# Patient Record
Sex: Male | Born: 1940 | Race: White | Hispanic: No | Marital: Married | Smoking: Never smoker
Health system: Southern US, Community
[De-identification: ages and names within clinical notes are randomized; demographics above are authoritative.]

---

## 2003-09-08 ENCOUNTER — Emergency Department (HOSPITAL_COMMUNITY): Admission: EM | Admit: 2003-09-08 | Discharge: 2003-09-08 | Payer: Self-pay | Admitting: Podiatry

## 2003-12-27 ENCOUNTER — Ambulatory Visit (HOSPITAL_BASED_OUTPATIENT_CLINIC_OR_DEPARTMENT_OTHER): Admission: RE | Admit: 2003-12-27 | Discharge: 2003-12-27 | Payer: Self-pay | Admitting: Orthopedic Surgery

## 2003-12-27 ENCOUNTER — Ambulatory Visit (HOSPITAL_COMMUNITY): Admission: RE | Admit: 2003-12-27 | Discharge: 2003-12-27 | Payer: Self-pay | Admitting: Orthopedic Surgery

## 2018-12-09 ENCOUNTER — Other Ambulatory Visit: Payer: Self-pay

## 2018-12-09 ENCOUNTER — Emergency Department: Payer: Medicare PPO

## 2018-12-09 ENCOUNTER — Emergency Department
Admission: EM | Admit: 2018-12-09 | Discharge: 2018-12-09 | Disposition: A | Discharge status: Home / Self Care | Payer: Medicare PPO | Attending: Emergency Medicine | Admitting: Emergency Medicine

## 2018-12-09 ENCOUNTER — Encounter: Payer: Self-pay | Admitting: *Deleted

## 2018-12-09 DIAGNOSIS — Y939 Activity, unspecified: Secondary | ICD-10-CM | POA: Diagnosis not present

## 2018-12-09 DIAGNOSIS — S61213A Laceration without foreign body of left middle finger without damage to nail, initial encounter: Secondary | ICD-10-CM | POA: Insufficient documentation

## 2018-12-09 DIAGNOSIS — Y999 Unspecified external cause status: Secondary | ICD-10-CM | POA: Diagnosis not present

## 2018-12-09 DIAGNOSIS — Y929 Unspecified place or not applicable: Secondary | ICD-10-CM | POA: Diagnosis not present

## 2018-12-09 DIAGNOSIS — S61215A Laceration without foreign body of left ring finger without damage to nail, initial encounter: Secondary | ICD-10-CM | POA: Insufficient documentation

## 2018-12-09 DIAGNOSIS — W268XXA Contact with other sharp object(s), not elsewhere classified, initial encounter: Secondary | ICD-10-CM | POA: Insufficient documentation

## 2018-12-09 DIAGNOSIS — S61212A Laceration without foreign body of right middle finger without damage to nail, initial encounter: Secondary | ICD-10-CM | POA: Insufficient documentation

## 2018-12-09 MED ORDER — HYDROCODONE-ACETAMINOPHEN 5-325 MG PO TABS
1.0000 | ORAL_TABLET | Freq: Once | ORAL | Status: AC
  Administered 2018-12-09: 1 via ORAL
  Filled 2018-12-09: qty 1

## 2018-12-09 MED ORDER — HYDROCODONE-ACETAMINOPHEN 5-325 MG PO TABS: 1.0000 | ORAL_TABLET | Freq: Four times a day (QID) | ORAL | 0 refills | Status: AC | PRN

## 2018-12-09 MED ORDER — CEPHALEXIN 500 MG PO CAPS: 500.0000 mg | ORAL_CAPSULE | Freq: Three times a day (TID) | ORAL | 0 refills | Status: DC

## 2018-12-09 MED ORDER — LIDOCAINE HCL (PF) 1 % IJ SOLN
10.0000 mL | Freq: Once | INTRAMUSCULAR | Status: DC
Start: 2018-12-09 — End: 2018-12-09
  Filled 2018-12-09: qty 10

## 2018-12-09 MED ORDER — LIDOCAINE HCL (PF) 1 % IJ SOLN
5.0000 mL | Freq: Once | INTRAMUSCULAR | Status: DC
  Filled 2018-12-09: qty 5

## 2018-12-09 MED ORDER — LIDOCAINE HCL (PF) 1 % IJ SOLN
INTRAMUSCULAR | Status: AC
  Administered 2018-12-09: 2.1 mL
  Filled 2018-12-09: qty 5

## 2018-12-09 MED ORDER — BACITRACIN-NEOMYCIN-POLYMYXIN 400-5-5000 EX OINT
TOPICAL_OINTMENT | Freq: Once | CUTANEOUS | Status: DC
  Filled 2018-12-09: qty 1

## 2018-12-09 MED ORDER — LIDOCAINE HCL (PF) 1 % IJ SOLN
5.0000 mL | Freq: Once | INTRAMUSCULAR | Status: AC
  Administered 2018-12-09: 5 mL via INTRADERMAL
  Filled 2018-12-09: qty 5

## 2018-12-09 MED ORDER — CEFTRIAXONE SODIUM 1 G IJ SOLR
1.0000 g | Freq: Once | INTRAMUSCULAR | Status: AC
  Administered 2018-12-09: 1 g via INTRAMUSCULAR
  Filled 2018-12-09: qty 10

## 2018-12-09 NOTE — ED Provider Notes (Signed)
[facility] Emergency Department Provider Note  ____________________________________________   First MD Initiated Contact with Patient 12/09/18 1552     (approximate)  I have reviewed the triage vital signs and the nursing notes.   HISTORY  Chief Complaint Hand Injury    HPI Piper Albro is a 78 y.o. male emergency department after cutting his right and left hand on a [employer] saw.  The right third finger and the left third and fourth fingers are affected.  Tetanus is up-to-date.  States he had one 6 months ago.  He states he has full range of motion of his fingers.    No past medical history on file.  There are no active problems to display for this patient.     Prior to Admission medications   Medication Sig Start Date End Date Taking? Authorizing Provider  cephALEXin (KEFLEX) 500 MG capsule Take 1 capsule (500 mg total) by mouth 3 (three) times daily. 12/09/18   Aries Kasa, Roselyn Bering, PA-C  HYDROcodone-acetaminophen (NORCO/VICODIN) 5-325 MG tablet Take 1 tablet by mouth every 6 (six) hours as needed for moderate pain. 12/09/18   Faythe Ghee, PA-C    Allergies Sulfa antibiotics  No family history on file.  Social History Social History   Tobacco Use  . Smoking status: Never Smoker  . Smokeless tobacco: Never Used  Substance Use Topics  . Alcohol use: Yes  . Drug use: Not on file    Review of Systems  Constitutional: No fever/chills Eyes: No visual changes. ENT: No sore throat. Respiratory: Denies cough Genitourinary: Negative for dysuria. Musculoskeletal: Negative for back pain.  Positive lacerations to the left and the right hand Skin: Negative for rash.    ____________________________________________   PHYSICAL EXAM:  VITAL SIGNS: ED Triage Vitals  Enc Vitals Group     BP 12/09/18 1549 (!) 181/101     Pulse Rate 12/09/18 1549 97     Resp 12/09/18 1549 20     Temp 12/09/18 1549 98.4 F (36.9 C)     Temp Source  12/09/18 1549 Oral     SpO2 12/09/18 1549 98 %     Weight 12/09/18 1546 175 lb (79.4 kg)     Height 12/09/18 1546  (1.753 m)     Head Circumference --      Peak Flow --      Pain Score 12/09/18 1546 8     Pain Loc --      Pain Edu? --      Excl. in GC? --     Constitutional: Alert and oriented. Well appearing and in no acute distress. Eyes: Conjunctivae are normal.  Head: Atraumatic. Nose: No congestion/rhinnorhea. Mouth/Throat: Mucous membranes are moist.   Neck:  supple no lymphadenopathy noted Cardiovascular: Normal rate, regular rhythm. Respiratory: Normal respiratory effort.  No retractions,  GU: deferred Musculoskeletal: FROM all extremities, warm and well perfused, laceration on the left third finger as possible joint capsule involvement, no foreign bodies noted, tendons appear to be intact.  Left fourth finger has a laceration no foreign body and no tendon involvement.  The right third finger has a laceration with no foreign body, the laceration extends to the volar and dorsal surface.  No tendon involvement is noted.   Neurologic:  Normal speech and language.  Skin:  Skin is warm, dry . No rash noted. Psychiatric: Mood and affect are normal. Speech and behavior are normal.  ____________________________________________   LABS (all labs ordered are listed,  but only abnormal results are displayed)  Labs Reviewed - No data to display ____________________________________________   ____________________________________________  RADIOLOGY  X-ray of the right hand does not show any fractures X-ray of the left hand does not show any fractures  ____________________________________________   PROCEDURES  Procedure(s) performed:   Marland KitchenMarland KitchenLaceration Repair Date/Time: 12/09/2018 5:54 PM Performed by: Faythe Ghee, PA-C Authorized by: Faythe Ghee, PA-C   Consent:    Consent obtained:  Verbal   Consent given by:  Patient   Risks discussed:  Infection, pain,  retained foreign body, tendon damage, vascular damage, poor wound healing, poor cosmetic result, need for additional repair and nerve damage   Alternatives discussed:  Referral Anesthesia (see MAR for exact dosages):    Anesthesia method:  Nerve block   Block needle gauge:  25 G   Block anesthetic:  Lidocaine 1% w/o epi   Block injection procedure:  Anatomic landmarks identified, introduced needle, incremental injection, anatomic landmarks palpated and negative aspiration for blood   Block outcome:  Anesthesia achieved Laceration details:    Location:  Finger   Finger location:  L long finger   Length (cm):  2   Depth (mm):  4 Repair type:    Repair type:  Intermediate Pre-procedure details:    Preparation:  Patient was prepped and draped in usual sterile fashion Exploration:    Hemostasis achieved with:  Direct pressure   Wound exploration: wound explored through full range of motion     Wound extent: foreign bodies/material     Wound extent: no muscle damage noted, no nerve damage noted, no tendon damage noted, no underlying fracture noted and no vascular damage noted     Foreign bodies/material:  Metal   Contaminated: yes   Treatment:    Area cleansed with:  Betadine and saline   Amount of cleaning:  Extensive   Irrigation solution:  Sterile saline   Irrigation method:  Pressure wash, syringe and tap   Visualized foreign bodies/material removed: yes   Skin repair:    Repair method:  Sutures   Suture size:  5-0   Suture material:  Nylon   Suture technique:  Simple interrupted   Number of sutures:  10 Approximation:    Approximation:  Close Post-procedure details:    Dressing:  Antibiotic ointment, non-adherent dressing and splint for protection   Patient tolerance of procedure:  Tolerated well, no immediate complications Comments:     Joint capsule appears to be violated .Marland KitchenLaceration Repair Date/Time: 12/09/2018 5:56 PM Performed by: Faythe Ghee, PA-C Authorized by:  Faythe Ghee, PA-C   Consent:    Consent obtained:  Verbal   Consent given by:  Patient   Risks discussed:  Infection, pain, retained foreign body, tendon damage, poor cosmetic result, need for additional repair, nerve damage, poor wound healing and vascular damage   Alternatives discussed:  Referral Anesthesia (see MAR for exact dosages):    Anesthesia method:  Nerve block   Block needle gauge:  25 G   Block anesthetic:  Lidocaine 1% w/o epi   Block injection procedure:  Anatomic landmarks identified, introduced needle, incremental injection, anatomic landmarks palpated and negative aspiration for blood   Block outcome:  Anesthesia achieved Laceration details:    Location:  Finger   Finger location:  L ring finger   Length (cm):  2   Depth (mm):  2 Repair type:    Repair type:  Simple Pre-procedure details:    Preparation:  Patient was  prepped and draped in usual sterile fashion Exploration:    Hemostasis achieved with:  Direct pressure   Wound exploration: wound explored through full range of motion     Wound extent: no fascia violation noted, no foreign bodies/material noted, no muscle damage noted, no nerve damage noted, no tendon damage noted, no underlying fracture noted and no vascular damage noted     Contaminated: no   Treatment:    Area cleansed with:  Betadine and saline   Amount of cleaning:  Standard   Irrigation solution:  Sterile saline   Irrigation method:  Pressure wash, syringe and tap Skin repair:    Repair method:  Sutures   Suture size:  5-0   Suture material:  Nylon   Suture technique:  Simple interrupted   Number of sutures:  5 Approximation:    Approximation:  Close Post-procedure details:    Dressing:  Antibiotic ointment, non-adherent dressing and splint for protection   Patient tolerance of procedure:  Tolerated well, no immediate complications .Marland KitchenLaceration Repair Date/Time: 12/09/2018 5:57 PM Performed by: Faythe Ghee, PA-C Authorized  by: Faythe Ghee, PA-C   Consent:    Consent obtained:  Verbal   Consent given by:  Patient   Risks discussed:  Infection, pain, retained foreign body, tendon damage, poor cosmetic result, need for additional repair, nerve damage and vascular damage   Alternatives discussed:  Referral Anesthesia (see MAR for exact dosages):    Anesthesia method:  Nerve block   Block needle gauge:  25 G   Block anesthetic:  Lidocaine 1% w/o epi   Block injection procedure:  Anatomic landmarks identified, introduced needle, incremental injection, anatomic landmarks palpated and negative aspiration for blood   Block outcome:  Anesthesia achieved Laceration details:    Location:  Finger   Finger location:  R long finger   Length (cm):  4 Repair type:    Repair type:  Intermediate Pre-procedure details:    Preparation:  Patient was prepped and draped in usual sterile fashion and imaging obtained to evaluate for foreign bodies Exploration:    Hemostasis achieved with:  Direct pressure   Wound exploration: wound explored through full range of motion     Wound extent: no fascia violation noted, no foreign bodies/material noted, no muscle damage noted, no nerve damage noted, no tendon damage noted, no underlying fracture noted and no vascular damage noted     Contaminated: no   Treatment:    Area cleansed with:  Betadine and saline   Amount of cleaning:  Extensive   Irrigation solution:  Sterile saline   Irrigation method:  Pressure wash, syringe and tap   Visualized foreign bodies/material removed: yes   Skin repair:    Repair method:  Sutures   Suture size:  5-0   Suture material:  Nylon   Suture technique:  Simple interrupted   Number of sutures:  14 Approximation:    Approximation:  Close Post-procedure details:    Dressing:  Antibiotic ointment and non-adherent dressing   Patient tolerance of procedure:  Tolerated well, no immediate complications       ____________________________________________   INITIAL IMPRESSION / ASSESSMENT AND PLAN / ED COURSE  Pertinent labs & imaging results that were available during my care of the patient were reviewed by me and considered in my medical decision making (see chart for details).   Patient is 78 year old male presents emergency department lacerations to both hands.  He was using a [employer] and it  broke and resulted in cutting both hands.  Tetanus is up-to-date.  Deep lacerations noted on the left middle finger with possible joint capsule involvement.  The area was cleaned extensively.  See procedure note for repair.  Page Dr. Allena Katz to notify him of the patient's laceration to the joint capsule.  He agrees that the treatment plan I have and stated is appropriate.  He wants him to follow-up in his office either on Friday or or Monday.  Due to the contamination and the joint capsule involvement patient was given Rocephin 1 g IM.  He will be given a prescription for Keflex 500 3 times daily and Vicodin 1 p.o. #15 with no refill.  He is to follow-up with orthopedics.     As part of my medical decision making, I reviewed the following data within the electronic MEDICAL RECORD NUMBER Nursing notes reviewed and incorporated, Old chart reviewed, Radiograph reviewed x-rays of the left and right hands are negative for fractures, A consult was requested and obtained from this/these consultant(s) Orthopedics, Notes from prior ED visits and [place] Controlled Substance Database  ____________________________________________   FINAL CLINICAL IMPRESSION(S) / ED DIAGNOSES  Final diagnoses:  Laceration of right middle finger without foreign body without damage to nail, initial encounter  Laceration of left middle finger, foreign body presence unspecified, nail damage status unspecified, initial encounter  Laceration of left ring finger without damage to nail, foreign body presence unspecified, initial encounter       NEW MEDICATIONS STARTED DURING THIS VISIT:  New Prescriptions   CEPHALEXIN (KEFLEX) 500 MG CAPSULE    Take 1 capsule (500 mg total) by mouth 3 (three) times daily.   HYDROCODONE-ACETAMINOPHEN (NORCO/VICODIN) 5-325 MG TABLET    Take 1 tablet by mouth every 6 (six) hours as needed for moderate pain.     Note:  This document was prepared using Dragon voice recognition software and may include unintentional dictation errors.    Faythe Ghee, PA-C 12/09/18 Noralyn Pick, [place], MD 12/09/18 2040

## 2018-12-09 NOTE — ED Notes (Signed)
PT soaking hands in betadine solution

## 2018-12-09 NOTE — Discharge Instructions (Signed)
Follow-up with Dr. Allena Katz.  Please call his office in the morning for an appointment on Friday or Monday.  Wear the splint until evaluated by [facility] clinic orthopedics.  Take the medication as prescribed.  The dressing on the right hand may be changed in 24 hours.  Do not disrupt the dressing on the left hand.

## 2018-12-09 NOTE — ED Triage Notes (Addendum)
Pt states masonary grinder blew up.  Pt has lacs to right 3 finger, left 3rd and 4th fingers.  Bleeding controlled.  bp elevated.  Took meds this am.  Pt alert

## 2020-07-16 ENCOUNTER — Emergency Department (HOSPITAL_COMMUNITY)
Admission: EM | Admit: 2020-07-16 | Discharge: 2020-07-16 | Disposition: A | Discharge status: Home / Self Care | Payer: Medicare PPO | Attending: Emergency Medicine | Admitting: Emergency Medicine

## 2020-07-16 ENCOUNTER — Other Ambulatory Visit: Payer: Self-pay

## 2020-07-16 ENCOUNTER — Ambulatory Visit
Admission: EM | Admit: 2020-07-16 | Discharge: 2020-07-16 | Disposition: A | Discharge status: Home / Self Care | Payer: Medicare PPO | Attending: Physician Assistant | Admitting: Physician Assistant

## 2020-07-16 ENCOUNTER — Ambulatory Visit (INDEPENDENT_AMBULATORY_CARE_PROVIDER_SITE_OTHER): Payer: Medicare PPO

## 2020-07-16 ENCOUNTER — Emergency Department (HOSPITAL_COMMUNITY): Payer: Medicare PPO

## 2020-07-16 ENCOUNTER — Encounter (HOSPITAL_COMMUNITY): Payer: Self-pay | Admitting: Emergency Medicine

## 2020-07-16 DIAGNOSIS — M79621 Pain in right upper arm: Secondary | ICD-10-CM | POA: Diagnosis not present

## 2020-07-16 DIAGNOSIS — W1830XA Fall on same level, unspecified, initial encounter: Secondary | ICD-10-CM | POA: Diagnosis not present

## 2020-07-16 DIAGNOSIS — S63391A Traumatic rupture of other ligament of right wrist, initial encounter: Secondary | ICD-10-CM | POA: Diagnosis not present

## 2020-07-16 DIAGNOSIS — W19XXXA Unspecified fall, initial encounter: Secondary | ICD-10-CM | POA: Diagnosis not present

## 2020-07-16 DIAGNOSIS — S4991XA Unspecified injury of right shoulder and upper arm, initial encounter: Secondary | ICD-10-CM | POA: Diagnosis present

## 2020-07-16 DIAGNOSIS — S43004A Unspecified dislocation of right shoulder joint, initial encounter: Secondary | ICD-10-CM | POA: Insufficient documentation

## 2020-07-16 DIAGNOSIS — M25331 Other instability, right wrist: Secondary | ICD-10-CM

## 2020-07-16 DIAGNOSIS — M25531 Pain in right wrist: Secondary | ICD-10-CM | POA: Diagnosis not present

## 2020-07-16 MED ORDER — PROPOFOL 10 MG/ML IV BOLUS
0.5000 mg/kg | Freq: Once | INTRAVENOUS | Status: DC
  Filled 2020-07-16: qty 20

## 2020-07-16 MED ORDER — LIDOCAINE-EPINEPHRINE 1 %-1:100000 IJ SOLN: 10.0000 mL | Freq: Once | INTRAMUSCULAR | Status: DC

## 2020-07-16 MED ORDER — FENTANYL CITRATE (PF) 100 MCG/2ML IJ SOLN
100.0000 ug | Freq: Once | INTRAMUSCULAR | Status: AC
  Administered 2020-07-16: 100 ug via INTRAVENOUS
  Filled 2020-07-16: qty 2

## 2020-07-16 MED ORDER — LIDOCAINE HCL (PF) 1 % IJ SOLN
INTRAMUSCULAR | Status: AC
  Filled 2020-07-16: qty 30

## 2020-07-16 MED ORDER — PROPOFOL 10 MG/ML IV BOLUS
INTRAVENOUS | Status: AC | PRN
  Administered 2020-07-16 (×3): 10 mg via INTRAVENOUS

## 2020-07-16 MED ORDER — MORPHINE SULFATE 15 MG PO TABS
7.5000 mg | ORAL_TABLET | ORAL | 0 refills | Status: AC | PRN
Start: 2020-07-16 — End: ?

## 2020-07-16 MED ORDER — SODIUM CHLORIDE 0.9 % IV SOLN
INTRAVENOUS | Status: AC | PRN
  Administered 2020-07-16: 10 mL/h via INTRAVENOUS

## 2020-07-16 NOTE — Discharge Instructions (Signed)
79 year old male with right shoulder dislocation since yesterday.  Discharged in stable condition to the ED for reduction.

## 2020-07-16 NOTE — ED Triage Notes (Signed)
Pt states he feel retrieving wood for his fire pit and fell. He is experiencing tenderness from his shoulder to his wrist. Pt is aox4 and ambulatory. PT has what could be some deformity but definitely swelling.

## 2020-07-16 NOTE — ED Notes (Signed)
Pt resting in bed. Pain stable. AFVSS.

## 2020-07-16 NOTE — ED Provider Notes (Addendum)
[facility] EMERGENCY DEPARTMENT Provider Note   CSN: [id number] Arrival date & time: 07/16/20  1451     History Chief Complaint  Patient presents with  . Shoulder Injury    Connor Gardner is a 79 y.o. male.  79 yo M with a chief complaints of right shoulder pain.  This occurred after a fall last night.  The patient was walking to get more firewood and he tripped over a stump and landed onto the right shoulder.  Complaining of pain to the right shoulder and wrist.  Denies head injury denies loss consciousness denies neck pain back pain chest pain abdominal pain.  Denies extremity pain.  The patient went to urgent care today because of severe pain and spasm and difficulty using the right arm.  Found to have a right shoulder dislocation and a scapholunate disassociation.  Sent to the ED for evaluation.  The history is provided by the patient.  Shoulder Injury This is a new problem. The current episode started yesterday. The problem occurs constantly. The problem has been gradually worsening. Pertinent negatives include no chest pain, no abdominal pain, no headaches and no shortness of breath. Nothing aggravates the symptoms. Nothing relieves the symptoms. He has tried nothing for the symptoms. The treatment provided no relief.       History reviewed. No pertinent past medical history.  There are no problems to display for this patient.   History reviewed. No pertinent surgical history.     No family history on file.  Social History   Tobacco Use  . Smoking status: Never Smoker  . Smokeless tobacco: Never Used  Vaping Use  . Vaping Use: Never used  Substance Use Topics  . Alcohol use: Yes  . Drug use: Not Currently    Home Medications Prior to Admission medications   Medication Sig Start Date End Date Taking? Authorizing Provider  HYDROcodone-acetaminophen (NORCO/VICODIN) 5-325 MG tablet Take 1 tablet by mouth every 6 (six) hours as needed for  moderate pain. 12/09/18   Fisher, Roselyn Bering, PA-C  morphine (MSIR) 15 MG tablet Take 0.5 tablets (7.5 mg total) by mouth every 4 (four) hours as needed for severe pain. 07/16/20   Melene Plan, DO    Allergies    Sulfa antibiotics  Review of Systems   Review of Systems  Constitutional: Negative for chills and fever.  HENT: Negative for congestion and facial swelling.   Eyes: Negative for discharge and visual disturbance.  Respiratory: Negative for shortness of breath.   Cardiovascular: Negative for chest pain and palpitations.  Gastrointestinal: Negative for abdominal pain, diarrhea and vomiting.  Musculoskeletal: Positive for arthralgias and myalgias.  Skin: Negative for color change and rash.  Neurological: Negative for tremors, syncope and headaches.  Psychiatric/Behavioral: Negative for confusion and dysphoric mood.    Physical Exam Updated Vital Signs BP (!) 195/104 (BP Location: Left Arm)   Pulse (!) 107   Temp 97.8 F (36.6 C) (Oral)   Resp 18   Wt 75 kg   SpO2 96%   BMI 24.42 kg/m   Physical Exam Vitals and nursing note reviewed.  Constitutional:      Appearance: He is well-developed.  HENT:     Head: Normocephalic and atraumatic.  Eyes:     Pupils: Pupils are equal, round, and reactive to light.  Neck:     Vascular: No JVD.  Cardiovascular:     Rate and Rhythm: Normal rate and regular rhythm.     Heart sounds: No  murmur heard.  No friction rub. No gallop.   Pulmonary:     Effort: No respiratory distress.     Breath sounds: No wheezing.  Abdominal:     General: There is no distension.     Tenderness: There is no abdominal tenderness. There is no guarding or rebound.  Musculoskeletal:        General: Swelling and tenderness present. Normal range of motion.     Cervical back: Normal range of motion and neck supple.     Comments: Deformity and pain to the right shoulder.  Pulse motor and sensation intact distally.  No pain or crepitus about the clavicle.  No  pain about the scapula.  Significant trapezius tenderness.    Skin:    Coloration: Skin is not pale.     Findings: No rash.  Neurological:     Mental Status: He is alert and oriented to person, place, and time.  Psychiatric:        Behavior: Behavior normal.     ED Results / Procedures / Treatments   Labs (all labs ordered are listed, but only abnormal results are displayed) Labs Reviewed - No data to display  EKG None  Radiology DG Wrist Complete Right  Result Date: 07/16/2020 CLINICAL DATA:  Pain after fall EXAM: RIGHT WRIST - COMPLETE 3+ VIEW COMPARISON:  None. FINDINGS: Vascular calcifications. No wrist dislocation. No fractures. The scapholunate interval measures 4.2 mm. IMPRESSION: 1. The scapholunate interval is widened measuring 4.2 mm consistent with a scapholunate ligament tear. This is an age-indeterminate finding. 2. No acute fractures. Electronically Signed   By: Gerome Sam III M.D   On: 07/16/2020 14:45   DG Shoulder Right Portable  Result Date: 07/16/2020 CLINICAL DATA:  Postreduction EXAM: PORTABLE RIGHT SHOULDER COMPARISON:  None. FINDINGS: Degenerative changes in the right shoulder with joint space narrowing and spurring in the [facility] and glenohumeral joints. No acute bony abnormality. Specifically, no fracture, subluxation, or dislocation. IMPRESSION: No acute bony abnormality. Electronically Signed   By: Charlett Nose M.D.   On: 07/16/2020 18:16   DG Humerus Right  Result Date: 07/16/2020 CLINICAL DATA:  Fall.  Pain. EXAM: RIGHT HUMERUS - 2+ VIEW COMPARISON:  None. FINDINGS: There is an anterior dislocation of the right shoulder. No humeral fractures are identified. There are fractures associated with the glenoid, age indeterminate. IMPRESSION: Anterior dislocation of the right shoulder. No humeral fractures. Deformity of the glenoid consistent with age-indeterminate fractures. Recommend clinical correlation. Electronically Signed   By: Gerome Sam III M.D    On: 07/16/2020 14:42    Procedures .Sedation  Date/Time: 07/16/2020 6:57 PM Performed by: Melene Plan, DO Authorized by: Melene Plan, DO   Consent:    Consent obtained:  Verbal and written   Consent given by:  Patient   Risks discussed:  Allergic reaction, dysrhythmia, inadequate sedation, nausea, prolonged hypoxia resulting in organ damage, respiratory compromise necessitating ventilatory assistance and intubation and vomiting   Alternatives discussed:  Analgesia without sedation, anxiolysis and regional anesthesia Universal protocol:    Procedure explained and questions answered to patient or proxy's satisfaction: yes     Relevant documents present and verified: yes     Test results available and properly labeled: yes     Imaging studies available: yes     Required blood products, implants, devices, and special equipment available: yes     Site/side marked: yes     Immediately prior to procedure a time out was called: yes  Patient identity confirmation method:  Verbally with patient and arm band Indications:    Procedure performed:  Dislocation reduction   Procedure necessitating sedation performed by:  Physician performing sedation Pre-sedation assessment:    Time since last food or drink:  6   ASA classification: class 1 - normal, healthy patient     Neck mobility: normal     Mouth opening:  3 or more finger widths   Thyromental distance:  4 finger widths   Mallampati score:  II - soft palate, uvula, fauces visible   Pre-sedation assessments completed and reviewed: airway patency, cardiovascular function, hydration status, mental status, nausea/vomiting, pain level, respiratory function and temperature   Immediate pre-procedure details:    Reassessment: Patient reassessed immediately prior to procedure     Reviewed: vital signs, relevant labs/tests and NPO status     Verified: bag valve mask available, emergency equipment available, intubation equipment available, IV  patency confirmed, oxygen available and suction available   Procedure details (see MAR for exact dosages):    Preoxygenation:  Nasal cannula   Sedation:  Propofol   Intended level of sedation: moderate (conscious sedation)   Analgesia:  Fentanyl   Intra-procedure monitoring:  Blood pressure monitoring, cardiac monitor, continuous pulse oximetry, frequent LOC assessments, frequent vital sign checks and continuous capnometry   Intra-procedure events: none     Intra-procedure management:  Airway repositioning and supplemental oxygen   Total Provider sedation time (minutes):  35 Post-procedure details:    Attendance: Constant attendance by certified staff until patient recovered     Recovery: Patient returned to pre-procedure baseline     Post-sedation assessments completed and reviewed: airway patency, cardiovascular function, hydration status, mental status, nausea/vomiting, pain level, respiratory function and temperature     Patient is stable for discharge or admission: yes     Patient tolerance:  Tolerated well, no immediate complications Reduction of dislocation  Date/Time: 07/16/2020 6:58 PM Performed by: Melene Plan, DO Authorized by: Melene Plan, DO  Preparation: Patient was prepped and draped in the usual sterile fashion. Local anesthesia used: yes Anesthesia: local infiltration  Anesthesia: Local anesthesia used: yes Local Anesthetic: lidocaine 2% without epinephrine Anesthetic total: 10 mL  Sedation: Patient sedated: yes Sedation type: moderate (conscious) sedation Sedatives: propofol Analgesia: fentanyl  Patient tolerance: patient tolerated the procedure well with no immediate complications     SPLINT APPLICATION Date/Time: 8:18 PM Authorized by: Rae Roam Consent: Verbal consent obtained. Risks and benefits: risks, benefits and alternatives were discussed Consent given by: patient Splint applied by: orthopedic technician Location details: R  wrist Splint type: sugar tong Supplies used: orthoglass Post-procedure: The splinted body part was neurovascularly unchanged following the procedure. Patient tolerance: Patient tolerated the procedure well with no immediate complications.    (including critical care time)  Medications Ordered in ED Medications  lidocaine-EPINEPHrine (XYLOCAINE W/EPI) 1 %-1:100000 (with pres) injection 10 mL (10 mLs Intradermal Not Given 07/16/20 1530)  lidocaine (PF) (XYLOCAINE) 1 % injection (  Not Given 07/16/20 1906)  propofol (DIPRIVAN) 10 mg/mL bolus/IV push 37.5 mg (37.5 mg Intravenous Not Given 07/16/20 1905)  fentaNYL (SUBLIMAZE) injection 100 mcg (100 mcg Intravenous Given 07/16/20 1520)  fentaNYL (SUBLIMAZE) injection 100 mcg (100 mcg Intravenous Given 07/16/20 1735)  0.9 %  sodium chloride infusion (10 mL/hr Intravenous New Bag/Given 07/16/20 1752)  propofol (DIPRIVAN) 10 mg/mL bolus/IV push (10 mg Intravenous Given 07/16/20 1754)    ED Course  I have reviewed the triage vital signs and the nursing notes.  Pertinent labs & imaging results that were available during my care of the patient were reviewed by me and considered in my medical decision making (see chart for details).    MDM Rules/Calculators/A&P                          79 yo M with a chief complaints of right shoulder pain.  Patient was seen at urgent care and found to have a right shoulder dislocation.  Will attempt to reduce at bedside.  Reduced at bedside.  Placed in sling.  Ortho follow up.  I discussed case with Duwayne HeckJason Rogers recommended a Velcro splint.  By the time I reevaluate the patient he already had a fiberglass on.  We will have him keep that one in place.  Orthopedic follow-up.  8:18 PM:  I have discussed the diagnosis/risks/treatment options with the patient and believe the pt to be eligible for discharge home to follow-up with Ortho. We also discussed returning to the ED immediately if new or worsening sx occur.  We discussed the sx which are most concerning (e.g., sudden worsening pain, fever, inability to tolerate by mouth) that necessitate immediate return. Medications administered to the patient during their visit and any new prescriptions provided to the patient are listed below.  Medications given during this visit Medications  lidocaine-EPINEPHrine (XYLOCAINE W/EPI) 1 %-1:100000 (with pres) injection 10 mL (10 mLs Intradermal Not Given 07/16/20 1530)  lidocaine (PF) (XYLOCAINE) 1 % injection (  Not Given 07/16/20 1906)  propofol (DIPRIVAN) 10 mg/mL bolus/IV push 37.5 mg (37.5 mg Intravenous Not Given 07/16/20 1905)  fentaNYL (SUBLIMAZE) injection 100 mcg (100 mcg Intravenous Given 07/16/20 1520)  fentaNYL (SUBLIMAZE) injection 100 mcg (100 mcg Intravenous Given 07/16/20 1735)  0.9 %  sodium chloride infusion (10 mL/hr Intravenous New Bag/Given 07/16/20 1752)  propofol (DIPRIVAN) 10 mg/mL bolus/IV push (10 mg Intravenous Given 07/16/20 1754)     The patient appears reasonably screen and/or stabilized for discharge and I doubt any other medical condition or other [facility] requiring further screening, evaluation, or treatment in the ED at this time prior to discharge.   Final Clinical Impression(s) / ED Diagnoses Final diagnoses:  Dislocation of right shoulder joint, initial encounter  Scapholunate dissociation of right wrist    Rx / DC Orders ED Discharge Orders         Ordered    morphine (MSIR) 15 MG tablet  Every 4 hours PRN        07/16/20 2016           Melene PlanFloyd, Monette Omara, DO 07/16/20 2018    Melene PlanFloyd, Lenoir Facchini, DO 07/16/20 2019

## 2020-07-16 NOTE — ED Triage Notes (Signed)
Pt from [facility] for dislocation of his R shoulder.  States he tripped over a stump last night while getting wood for his fire pit.

## 2020-07-16 NOTE — ED Notes (Signed)
Pt resting in bed. Pain abating. MD preparing for simple reduction of r/shoulder

## 2020-07-16 NOTE — Progress Notes (Signed)
Orthopedic Tech Progress Note Patient Details:  Isham Smitherman January 04, 1941 [id number]  Ortho Devices Type of Ortho Device: Shoulder immobilizer Ortho Device/Splint Location: Right Upper Extremity Ortho Device/Splint Interventions: Ordered, Application   Post Interventions Patient Tolerated: Well Instructions Provided: Adjustment of device, Care of device, Poper ambulation with device   Humza Tallerico P Harle Stanford 07/16/2020, 6:30 PM

## 2020-07-16 NOTE — ED Notes (Signed)
Pt resting in bed. NADN. Awaiting RT and Pharmacy for concious sedation and closed reduction. Consent signed.

## 2020-07-16 NOTE — Discharge Instructions (Signed)

## 2020-07-16 NOTE — ED Provider Notes (Addendum)
[facility]    CSN: [id number] Arrival date & time: 07/16/20  1138      History   Chief Complaint Chief Complaint  Patient presents with  . Arm Injury    last night  . Wrist Pain    last night    HPI Connor Gardner is a 79 y.o. male.   79 year old male comes in for right shoulder pain after fall last night. States was retrieving wood when he tripped. Landed on right side, but unsure exactly where he hit. Pain and swelling to the right shoulder with some pain to the right wrist.      History reviewed. No pertinent past medical history.  There are no problems to display for this patient.   History reviewed. No pertinent surgical history.     Home Medications    Prior to Admission medications   Medication Sig Start Date End Date Taking? Authorizing Provider  HYDROcodone-acetaminophen (NORCO/VICODIN) 5-325 MG tablet Take 1 tablet by mouth every 6 (six) hours as needed for moderate pain. 12/09/18   Sherrie Mustache Roselyn Bering, PA-C    Family History History reviewed. No pertinent family history.  Social History Social History   Tobacco Use  . Smoking status: Never Smoker  . Smokeless tobacco: Never Used  Vaping Use  . Vaping Use: Never used  Substance Use Topics  . Alcohol use: Yes  . Drug use: Not Currently     Allergies   Sulfa antibiotics   Review of Systems Review of Systems  Reason unable to perform ROS: See HPI as above.     Physical Exam Triage Vital Signs ED Triage Vitals  Enc Vitals Group     BP 07/16/20 1323 (!) 204/104     Pulse Rate 07/16/20 1323 (!) 115     Resp 07/16/20 1323 18     Temp 07/16/20 1323 98.7 F (37.1 C)     Temp Source 07/16/20 1323 Oral     SpO2 07/16/20 1323 96 %     Weight --      Height --      Head Circumference --      Peak Flow --      Pain Score 07/16/20 1344 10     Pain Loc --      Pain Edu? --      Excl. in GC? --    No data found.  Updated Vital Signs BP (!) 204/104 (BP Location: Left  Arm)   Pulse (!) 115   Temp 98.7 F (37.1 C) (Oral)   Resp 18   SpO2 96%   Physical Exam Constitutional:      General: He is not in acute distress.    Appearance: Normal appearance. He is well-developed. He is not toxic-appearing or diaphoretic.  HENT:     Head: Normocephalic and atraumatic.  Eyes:     Conjunctiva/sclera: Conjunctivae normal.     Pupils: Pupils are equal, round, and reactive to light.  Pulmonary:     Effort: Pulmonary effort is normal. No respiratory distress.  Musculoskeletal:     Cervical back: Normal range of motion and neck supple.     Comments: No spinous processes tenderness  Left anterior shoulder deformity with swelling and contusion. Unable to move shoulder. Minimal tenderness to the wrist with swelling. No tenderness to elbow, forearm, hand. NVI  Skin:    General: Skin is warm and dry.  Neurological:     Mental Status: He is alert and oriented to person,  place, and time.      UC Treatments / Results  Labs (all labs ordered are listed, but only abnormal results are displayed) Labs Reviewed - No data to display  EKG   Radiology DG Wrist Complete Right  Result Date: 07/16/2020 CLINICAL DATA:  Pain after fall EXAM: RIGHT WRIST - COMPLETE 3+ VIEW COMPARISON:  None. FINDINGS: Vascular calcifications. No wrist dislocation. No fractures. The scapholunate interval measures 4.2 mm. IMPRESSION: 1. The scapholunate interval is widened measuring 4.2 mm consistent with a scapholunate ligament tear. This is an age-indeterminate finding. 2. No acute fractures. Electronically Signed   By: Gerome Sam III M.D   On: 07/16/2020 14:45   DG Humerus Right  Result Date: 07/16/2020 CLINICAL DATA:  Fall.  Pain. EXAM: RIGHT HUMERUS - 2+ VIEW COMPARISON:  None. FINDINGS: There is an anterior dislocation of the right shoulder. No humeral fractures are identified. There are fractures associated with the glenoid, age indeterminate. IMPRESSION: Anterior dislocation of  the right shoulder. No humeral fractures. Deformity of the glenoid consistent with age-indeterminate fractures. Recommend clinical correlation. Electronically Signed   By: Gerome Sam III M.D   On: 07/16/2020 14:42    Procedures Procedures (including critical care time)  Medications Ordered in UC Medications - No data to display  Initial Impression / Assessment and Plan / UC Course  I have reviewed the triage vital signs and the nursing notes.  Pertinent labs & imaging results that were available during my care of the patient were reviewed by me and considered in my medical decision making (see chart for details).    X-ray reviewed by me showed right shoulder dislocation.  Currently NVI. Still awaiting on x-ray results, but due to dislocation, discharged in stable condition to the ED for reduction.  Xray results showed scapholunate ligament tear for the wrist, will attempt to contact EDP for results.  Final Clinical Impressions(s) / UC Diagnoses   Final diagnoses:  Dislocation of right shoulder joint, initial encounter    ED Prescriptions    None     PDMP not reviewed this encounter.   Belinda Fisher, PA-C 07/16/20 1505    Linward Headland V, PA-C 07/16/20 1537

## 2020-07-16 NOTE — Progress Notes (Signed)
Orthopedic Tech Progress Note Patient Details:  Connor Gardner 09/15/1941 [id number] Applied sugar tong splint.  Ortho Devices Type of Ortho Device: Sugartong splint Ortho Device/Splint Location: RUE Ortho Device/Splint Interventions: Ordered, Application   Post Interventions Patient Tolerated: Well Instructions Provided: Care of device   Kerry Fort 07/16/2020, 7:50 PM

## 2021-03-07 IMAGING — DX DG SHOULDER 2+V PORT*R*
1 series · 2 of 2 positions shown · non-contrast
Comparison: None.

CLINICAL DATA: Postreduction

EXAM:
PORTABLE RIGHT SHOULDER

[Series 1: shoulder · 0.14mm/px · 2 of 2 slices shown]
[im 1/2]
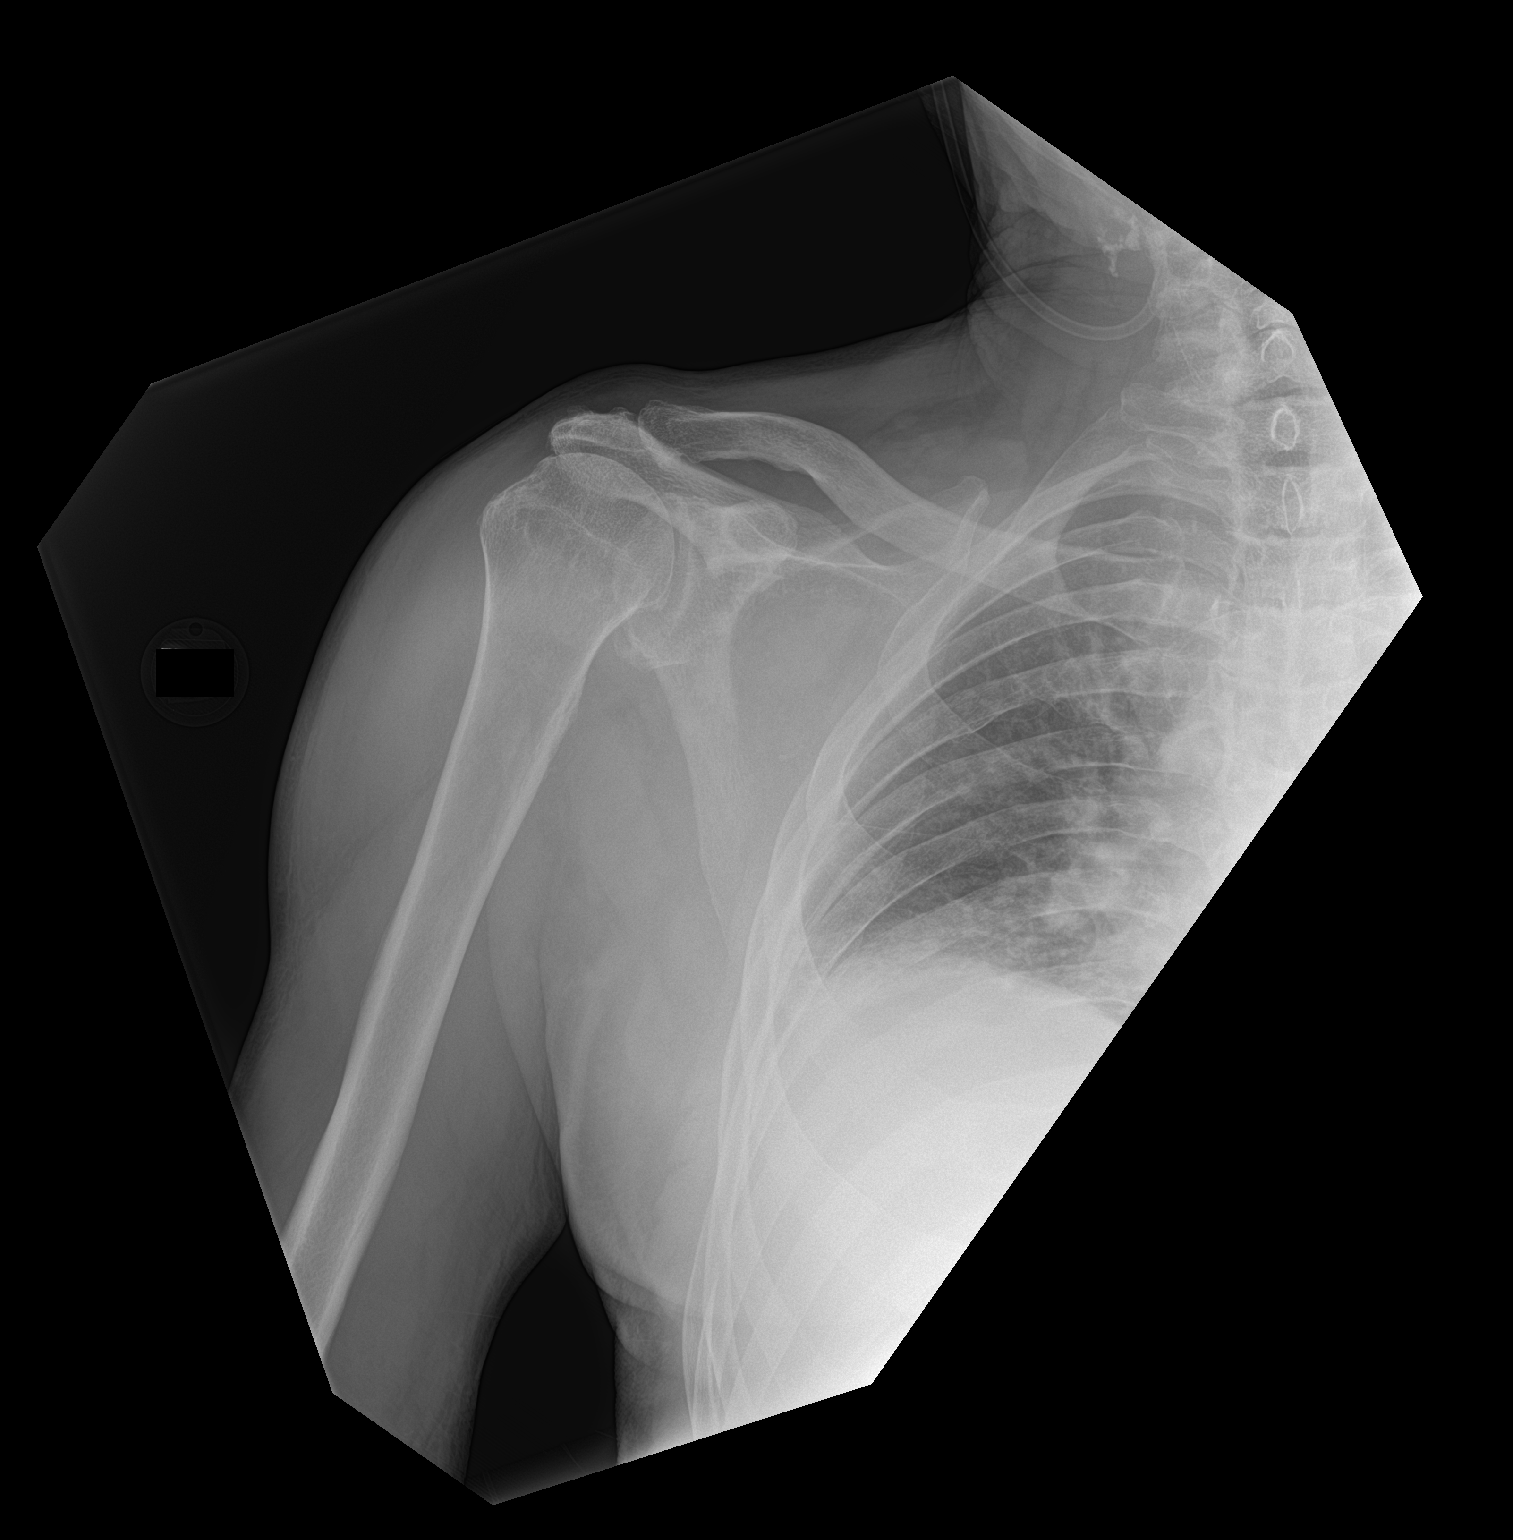
[im 2/2]
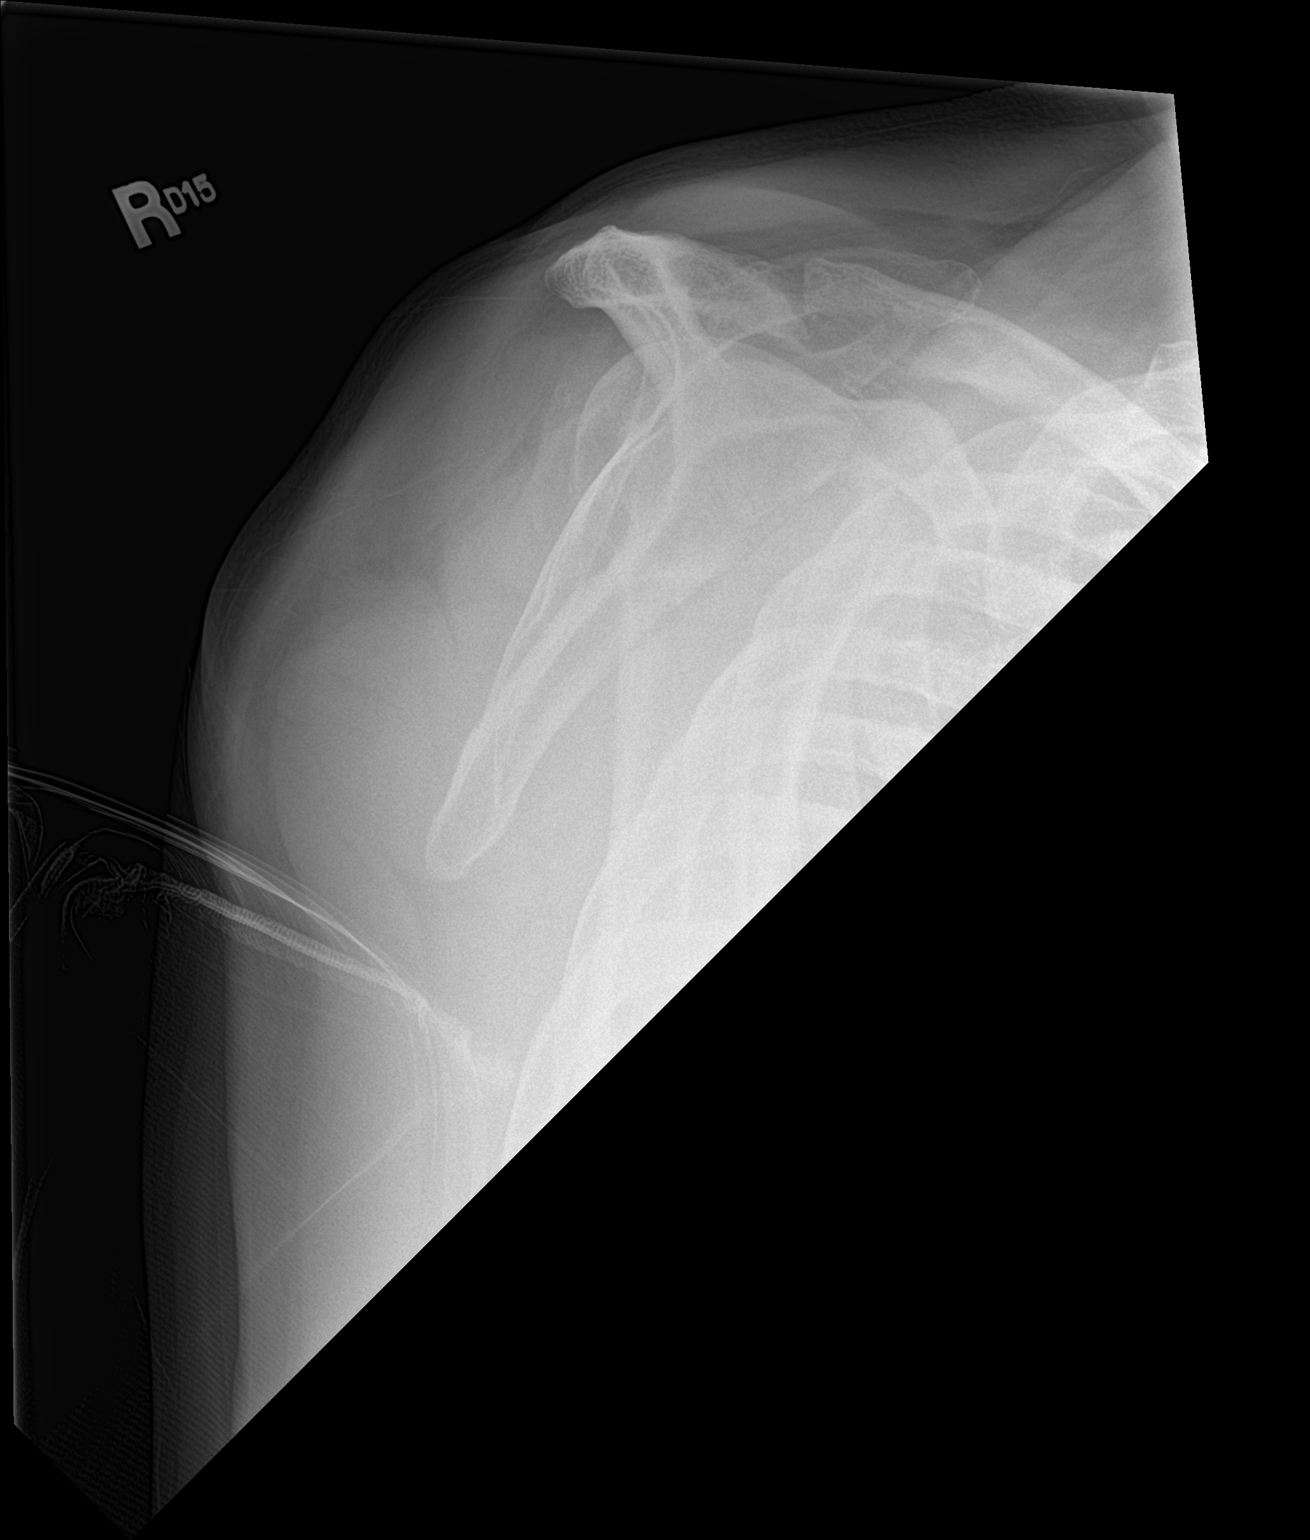

[2 of 2 positions shown; findings below may reference images not displayed]

FINDINGS: Degenerative changes in the right shoulder with joint space
narrowing and spurring in the AC and glenohumeral joints. No acute
bony abnormality. Specifically, no fracture, subluxation, or
dislocation.
IMPRESSION: No acute bony abnormality.

## 2021-03-07 IMAGING — DX DG HUMERUS 2V *R*
4 series · 4 of 4 positions shown · non-contrast
Comparison: None.

CLINICAL DATA: Fall.  Pain.

EXAM:
RIGHT HUMERUS - 2+ VIEW

[humerus ap (1 of 2)]
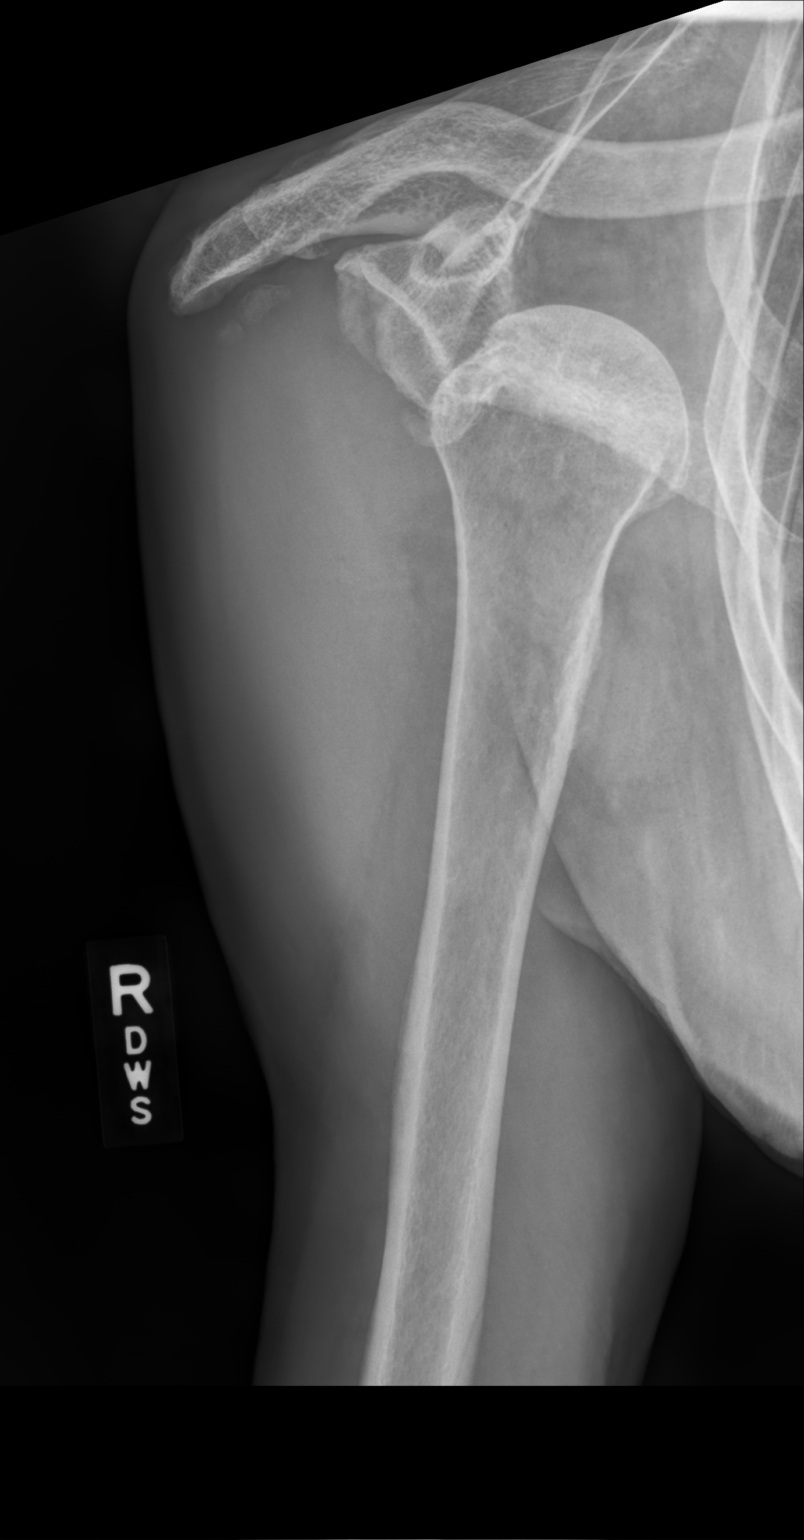

[humerus ap (2 of 2)]
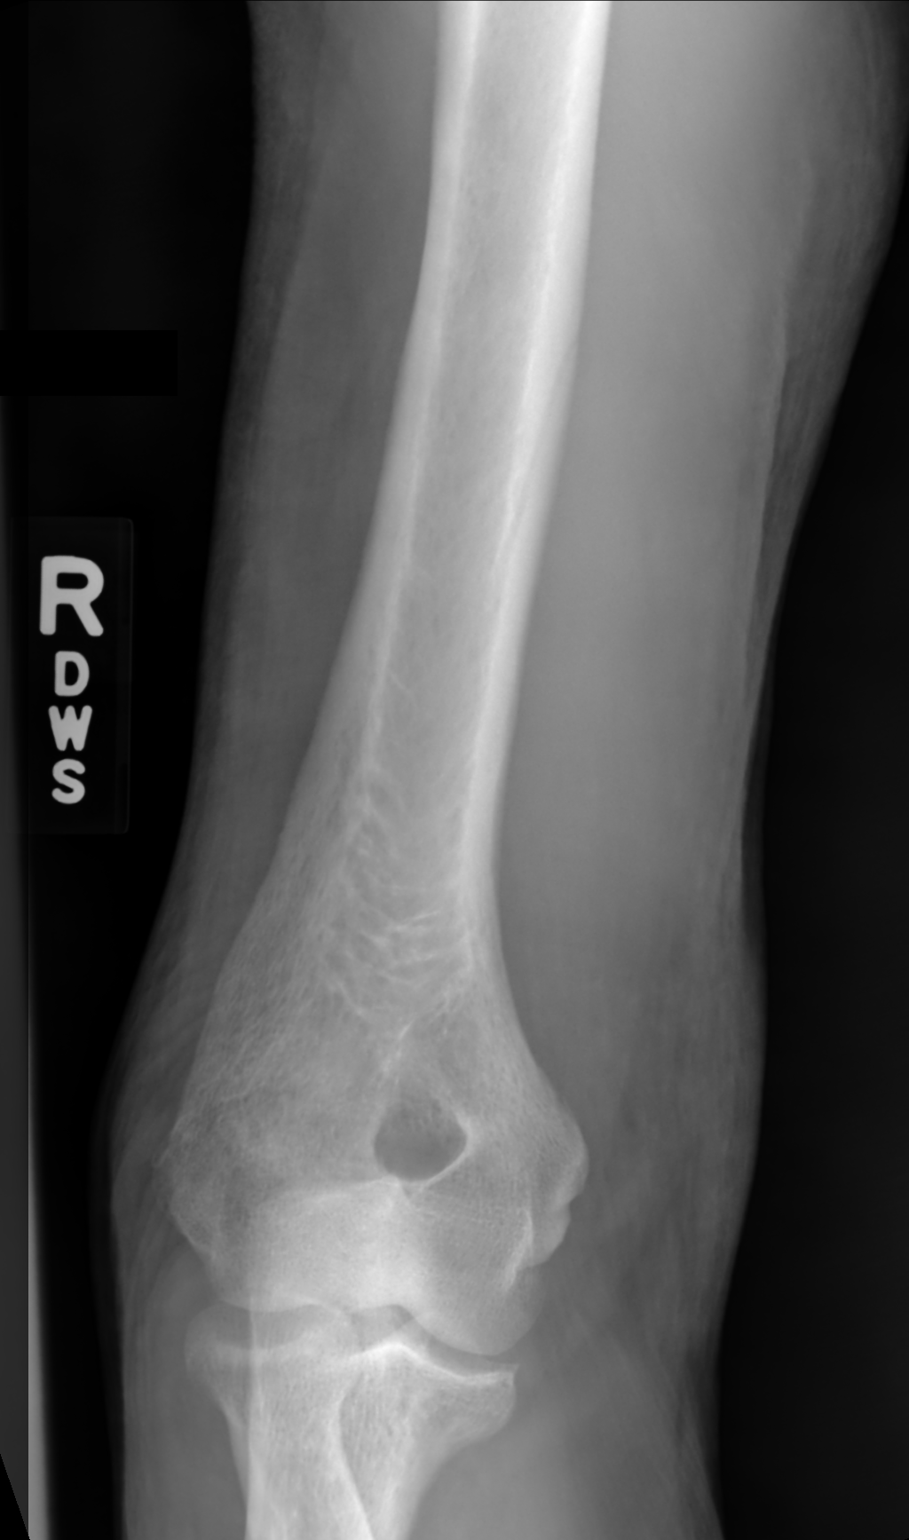

[humerus lat (1 of 2)]
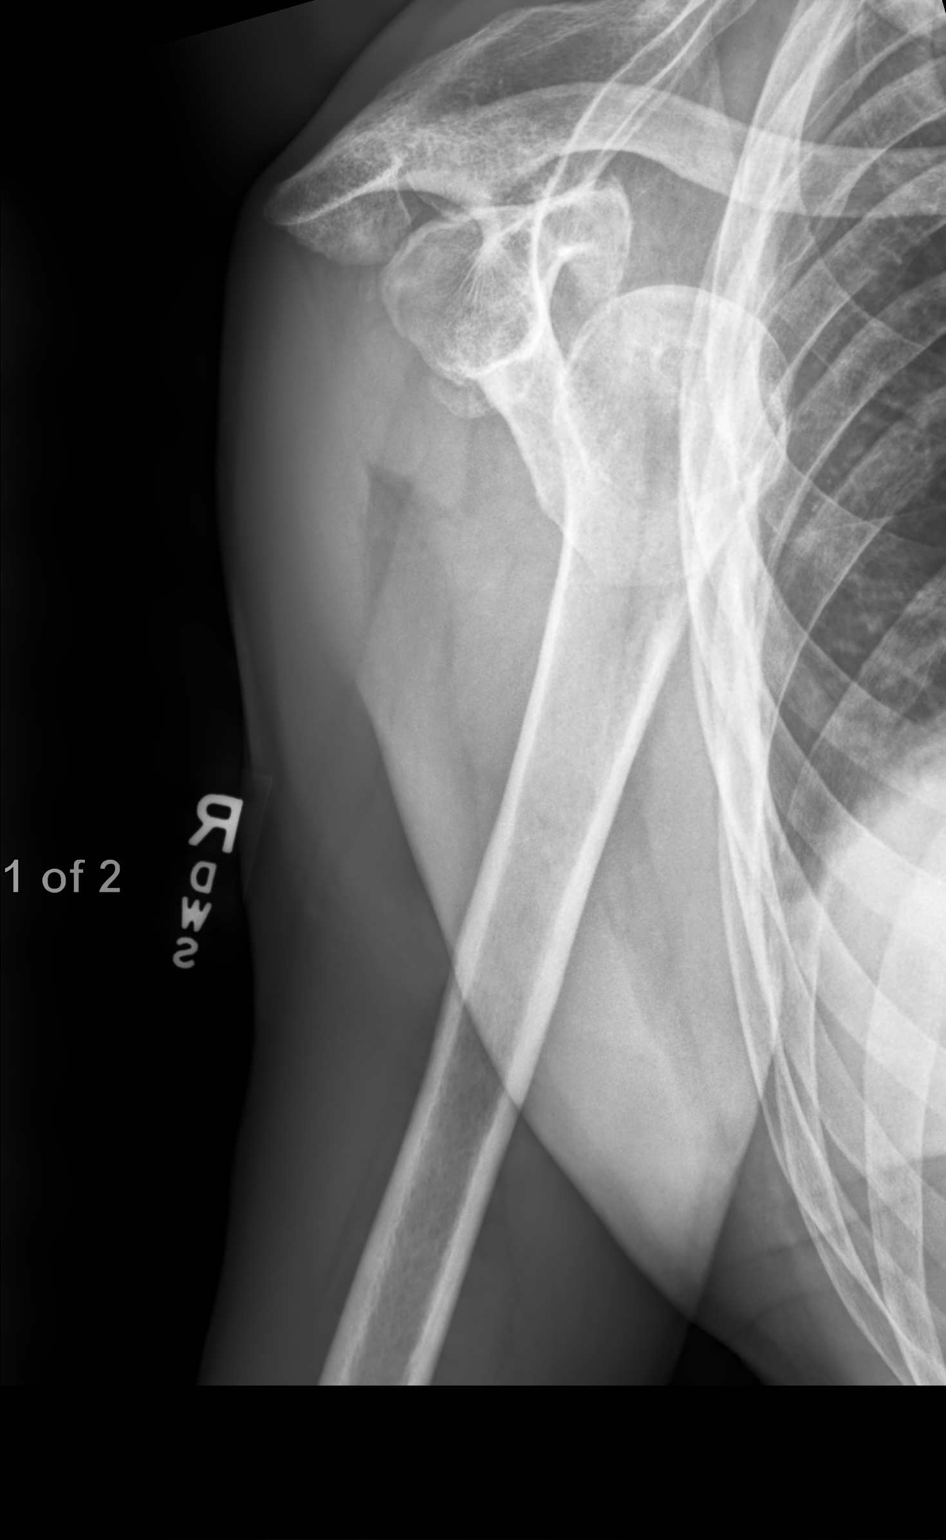

[humerus lat (2 of 2)]
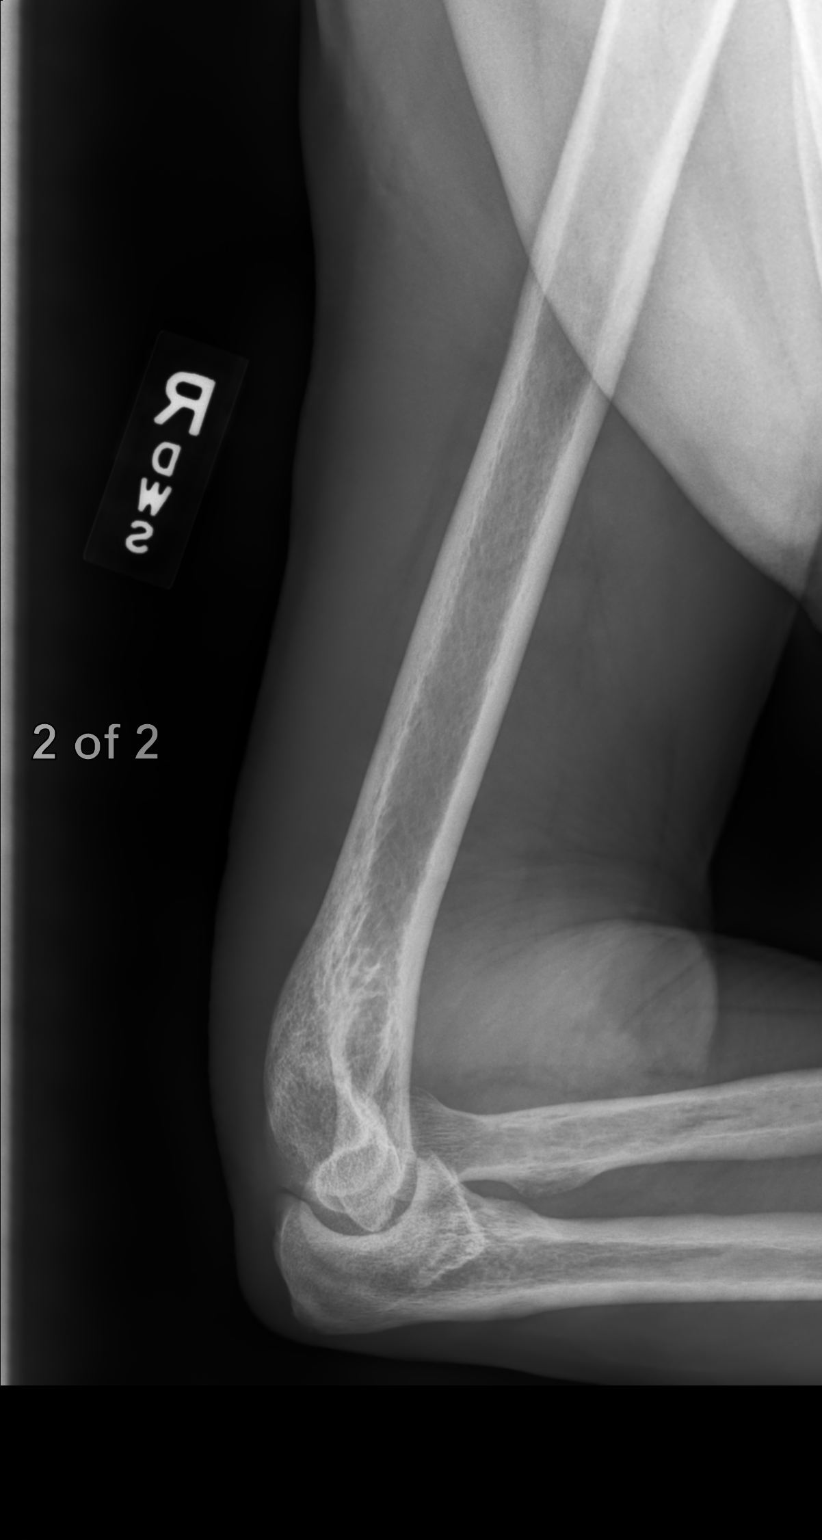

[4 of 4 positions shown; findings below may reference images not displayed]

FINDINGS: There is an anterior dislocation of the right shoulder. No humeral
fractures are identified. There are fractures associated with the
glenoid, age indeterminate.
IMPRESSION: Anterior dislocation of the right shoulder. No humeral fractures.
Deformity of the glenoid consistent with age-indeterminate
fractures. Recommend clinical correlation.
